# Patient Record
Sex: Male | Born: 1975 | Race: White | Hispanic: No | Marital: Married | Smoking: Current every day smoker
Health system: Southern US, Community
[De-identification: ages and names within clinical notes are randomized; demographics above are authoritative.]

## PROBLEM LIST (undated history)

## (undated) DIAGNOSIS — M1990 Unspecified osteoarthritis, unspecified site: Secondary | ICD-10-CM

## (undated) DIAGNOSIS — F32A Depression, unspecified: Secondary | ICD-10-CM

## (undated) DIAGNOSIS — F329 Major depressive disorder, single episode, unspecified: Secondary | ICD-10-CM

## (undated) DIAGNOSIS — K219 Gastro-esophageal reflux disease without esophagitis: Secondary | ICD-10-CM

## (undated) HISTORY — DX: Unspecified osteoarthritis, unspecified site: M19.90

---

## 2008-08-25 ENCOUNTER — Emergency Department: Payer: Self-pay | Admitting: Emergency Medicine

## 2010-06-23 ENCOUNTER — Emergency Department: Payer: Self-pay | Admitting: Emergency Medicine

## 2012-08-13 ENCOUNTER — Emergency Department: Payer: Self-pay | Admitting: Emergency Medicine

## 2012-08-22 ENCOUNTER — Emergency Department: Payer: Self-pay | Admitting: Emergency Medicine

## 2012-12-10 ENCOUNTER — Ambulatory Visit: Payer: Self-pay | Admitting: Family Medicine

## 2013-02-27 ENCOUNTER — Ambulatory Visit: Payer: Self-pay | Admitting: Family Medicine

## 2013-04-24 ENCOUNTER — Encounter: Payer: Self-pay | Admitting: Family Medicine

## 2013-05-17 ENCOUNTER — Encounter: Payer: Self-pay | Admitting: Family Medicine

## 2013-06-17 ENCOUNTER — Encounter: Payer: Self-pay | Admitting: Family Medicine

## 2013-10-03 ENCOUNTER — Encounter (HOSPITAL_COMMUNITY): Payer: Self-pay | Admitting: Emergency Medicine

## 2013-10-03 ENCOUNTER — Observation Stay (HOSPITAL_COMMUNITY)
Admission: EM | Admit: 2013-10-03 | Discharge: 2013-10-04 | Disposition: A | Discharge status: Home / Self Care | Attending: Internal Medicine | Admitting: Internal Medicine

## 2013-10-03 DIAGNOSIS — R42 Dizziness and giddiness: Secondary | ICD-10-CM | POA: Diagnosis not present

## 2013-10-03 DIAGNOSIS — F329 Major depressive disorder, single episode, unspecified: Secondary | ICD-10-CM | POA: Insufficient documentation

## 2013-10-03 DIAGNOSIS — R079 Chest pain, unspecified: Principal | ICD-10-CM

## 2013-10-03 DIAGNOSIS — F17200 Nicotine dependence, unspecified, uncomplicated: Secondary | ICD-10-CM | POA: Diagnosis not present

## 2013-10-03 DIAGNOSIS — R05 Cough: Secondary | ICD-10-CM | POA: Insufficient documentation

## 2013-10-03 DIAGNOSIS — J3489 Other specified disorders of nose and nasal sinuses: Secondary | ICD-10-CM | POA: Diagnosis not present

## 2013-10-03 DIAGNOSIS — R0602 Shortness of breath: Secondary | ICD-10-CM | POA: Diagnosis not present

## 2013-10-03 DIAGNOSIS — Z79899 Other long term (current) drug therapy: Secondary | ICD-10-CM | POA: Diagnosis not present

## 2013-10-03 DIAGNOSIS — Z720 Tobacco use: Secondary | ICD-10-CM

## 2013-10-03 DIAGNOSIS — F3289 Other specified depressive episodes: Secondary | ICD-10-CM | POA: Diagnosis not present

## 2013-10-03 DIAGNOSIS — R059 Cough, unspecified: Secondary | ICD-10-CM | POA: Insufficient documentation

## 2013-10-03 HISTORY — DX: Major depressive disorder, single episode, unspecified: F32.9

## 2013-10-03 HISTORY — DX: Gastro-esophageal reflux disease without esophagitis: K21.9

## 2013-10-03 HISTORY — DX: Depression, unspecified: F32.A

## 2013-10-03 NOTE — ED Notes (Addendum)
Per EMS, pt reports intermittent CP with SOB associated x 2 weeks. Pt reports that he wakes up fine and it gets worse throughout the day. NAD at this time. VS stable. Pt took 325 of aspirin prior to ems arrival.

## 2013-10-04 ENCOUNTER — Emergency Department (HOSPITAL_COMMUNITY)

## 2013-10-04 ENCOUNTER — Encounter (HOSPITAL_COMMUNITY): Payer: Self-pay | Admitting: General Practice

## 2013-10-04 DIAGNOSIS — R42 Dizziness and giddiness: Secondary | ICD-10-CM

## 2013-10-04 DIAGNOSIS — F17200 Nicotine dependence, unspecified, uncomplicated: Secondary | ICD-10-CM

## 2013-10-04 DIAGNOSIS — R079 Chest pain, unspecified: Secondary | ICD-10-CM

## 2013-10-04 DIAGNOSIS — Z720 Tobacco use: Secondary | ICD-10-CM | POA: Diagnosis present

## 2013-10-04 LAB — BASIC METABOLIC PANEL
ANION GAP: 16 — AB (ref 5–15)
BUN: 12 mg/dL (ref 6–23)
CO2: 22 meq/L (ref 19–32)
CREATININE: 0.97 mg/dL (ref 0.50–1.35)
Calcium: 8.8 mg/dL (ref 8.4–10.5)
Chloride: 105 mEq/L (ref 96–112)
GFR calc non Af Amer: >90 mL/min (ref >90)
Glucose, Bld: 98 mg/dL (ref 70–99)
Potassium: 4.2 mEq/L (ref 3.7–5.3)
Sodium: 143 mEq/L (ref 137–147)

## 2013-10-04 LAB — TROPONIN I
Troponin I: <0.3 ng/mL (ref <0.30)
Troponin I: <0.3 ng/mL (ref <0.30)

## 2013-10-04 LAB — CBC WITH DIFFERENTIAL/PLATELET
BASOS ABS: 0 10*3/uL (ref 0.0–0.1)
Basophils Relative: 1 % (ref 0–1)
EOS ABS: 0.2 10*3/uL (ref 0.0–0.7)
Eosinophils Relative: 3 % (ref 0–5)
HEMATOCRIT: 41.9 % (ref 39.0–52.0)
Hemoglobin: 14.4 g/dL (ref 13.0–17.0)
Lymphocytes Relative: 25 % (ref 12–46)
Lymphs Abs: 2.2 10*3/uL (ref 0.7–4.0)
MCH: 32.6 pg (ref 26.0–34.0)
MCHC: 34.4 g/dL (ref 30.0–36.0)
MCV: 94.8 fL (ref 78.0–100.0)
MONO ABS: 0.8 10*3/uL (ref 0.1–1.0)
Monocytes Relative: 10 % (ref 3–12)
Neutro Abs: 5.3 10*3/uL (ref 1.7–7.7)
Neutrophils Relative %: 61 % (ref 43–77)
Platelets: 204 10*3/uL (ref 150–400)
RBC: 4.42 MIL/uL (ref 4.22–5.81)
RDW: 12.5 % (ref 11.5–15.5)
WBC: 8.5 10*3/uL (ref 4.0–10.5)

## 2013-10-04 LAB — D-DIMER, QUANTITATIVE: D-Dimer, Quant: <0.27 ug/mL-FEU (ref 0.00–0.48)

## 2013-10-04 LAB — MAGNESIUM: Magnesium: 2.1 mg/dL (ref 1.5–2.5)

## 2013-10-04 LAB — I-STAT TROPONIN, ED: Troponin i, poc: 0 ng/mL (ref 0.00–0.08)

## 2013-10-04 MED ORDER — HEPARIN SODIUM (PORCINE) 5000 UNIT/ML IJ SOLN
5000.0000 [IU] | Freq: Three times a day (TID) | INTRAMUSCULAR | Status: DC
  Administered 2013-10-04: 5000 [IU] via SUBCUTANEOUS
  Filled 2013-10-04: qty 1

## 2013-10-04 MED ORDER — ALBUTEROL SULFATE HFA 108 (90 BASE) MCG/ACT IN AERS: 2.0000 | INHALATION_SPRAY | Freq: Four times a day (QID) | RESPIRATORY_TRACT | Status: DC | PRN

## 2013-10-04 MED ORDER — ALBUTEROL SULFATE (2.5 MG/3ML) 0.083% IN NEBU: 2.5000 mg | INHALATION_SOLUTION | Freq: Four times a day (QID) | RESPIRATORY_TRACT | Status: DC | PRN

## 2013-10-04 MED ORDER — CLOMIPHENE CITRATE 50 MG PO TABS: 50.0000 mg | ORAL_TABLET | ORAL | Status: DC

## 2013-10-04 MED ORDER — BUPROPION HCL ER (XL) 150 MG PO TB24
150.0000 mg | ORAL_TABLET | Freq: Every day | ORAL | Status: DC
  Filled 2013-10-04: qty 1

## 2013-10-04 MED ORDER — ACETAMINOPHEN 325 MG PO TABS: 650.0000 mg | ORAL_TABLET | Freq: Four times a day (QID) | ORAL | Status: DC | PRN

## 2013-10-04 MED ORDER — SODIUM CHLORIDE 0.9 % IJ SOLN: 3.0000 mL | Freq: Two times a day (BID) | INTRAMUSCULAR | Status: DC

## 2013-10-04 MED ORDER — NICOTINE 21 MG/24HR TD PT24
21.0000 mg | MEDICATED_PATCH | Freq: Every day | TRANSDERMAL | Status: DC
  Administered 2013-10-04: 21 mg via TRANSDERMAL
  Filled 2013-10-04: qty 1

## 2013-10-04 MED ORDER — MELOXICAM 15 MG PO TABS
15.0000 mg | ORAL_TABLET | Freq: Every day | ORAL | Status: DC
  Filled 2013-10-04: qty 1

## 2013-10-04 MED ORDER — PANTOPRAZOLE SODIUM 40 MG PO TBEC
40.0000 mg | DELAYED_RELEASE_TABLET | Freq: Every day | ORAL | Status: DC
  Administered 2013-10-04: 40 mg via ORAL
  Filled 2013-10-04: qty 1

## 2013-10-04 MED ORDER — CLOMIPHENE CITRATE 50 MG PO TABS
50.0000 mg | ORAL_TABLET | ORAL | Status: DC
  Filled 2013-10-04: qty 1

## 2013-10-04 MED ORDER — TRAMADOL HCL 50 MG PO TABS: 50.0000 mg | ORAL_TABLET | Freq: Four times a day (QID) | ORAL | Status: AC | PRN

## 2013-10-04 NOTE — H&P (Signed)
[facility] History and Physical  KRISTOFOR MICHALOWSKI ZOX:[id number] DOB: 1976-01-03 DOA: 10/03/2013  Referring physician: EDP PCP: Pcp Not In System   Chief Complaint: Chest pain   HPI: Gabriel Hudson is a 38 y.o. male no PMH, presents with CP, SOB, dizziness and near syncope events for past 3 weeks.  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  He has no family history of sudden cardiac death that he knows about.  Review of Systems: Systems reviewed.  As above, otherwise negative  Past Medical History  Diagnosis Date  . Depression    History reviewed. No pertinent past surgical history. Social History:  reports that he has been smoking Cigarettes.  He has been smoking about 0.75 packs per day. He does not have any smokeless tobacco history on file. He reports that he drinks about 25.2 ounces of alcohol per week. He reports that he does not use illicit drugs.  Allergies  Allergen Reactions  . Citrus Itching    All acidic fruits     No family history on file. Negative for sudden cardiac death that he knows about.  Prior to Admission medications   Medication Sig Start Date End Date Taking? Authorizing Provider  acetaminophen (TYLENOL) 325 MG tablet Take 650 mg by mouth every 6 (six) hours as needed for mild pain.   Yes Historical Provider, MD  albuterol (PROVENTIL HFA;VENTOLIN HFA) 108 (90 BASE) MCG/ACT inhaler Inhale 2 puffs into the lungs every 6 (six) hours as needed for wheezing or shortness of breath.   Yes Historical Provider, MD  buPROPion (WELLBUTRIN XL) 150 MG 24 hr tablet Take 150 mg by mouth at bedtime.   Yes Historical Provider, MD  clomiPHENE (CLOMID) 50 MG tablet Take 50 mg by mouth 3 (three) times a week. Monday, Wednesday and Friday   Yes Historical Provider, MD  cyclobenzaprine (FLEXERIL) 10 MG tablet Take 10 mg by mouth at  bedtime.   Yes Historical Provider, MD  meloxicam (MOBIC) 15 MG tablet Take 15 mg by mouth at bedtime.   Yes Historical Provider, MD  omeprazole (PRILOSEC) 20 MG capsule Take 20 mg by mouth at bedtime.   Yes Historical Provider, MD   Physical Exam: Filed Vitals:   10/04/13 0530  BP: 117/66  Pulse: 83  Temp:   Resp: 14    BP 117/66  Pulse 83  Temp(Src) 98 F (36.7 C) (Oral)  Resp 14  SpO2 94%  General Appearance:    Alert, oriented, no distress, appears stated age  Head:    Normocephalic, atraumatic  Eyes:    PERRL, EOMI, sclera non-icteric        Nose:   Nares without drainage or epistaxis. Mucosa, turbinates normal  Throat:   Moist mucous membranes. Oropharynx without erythema or exudate.  Neck:   Supple. No carotid bruits.  No thyromegaly.  No lymphadenopathy.   Back:     No CVA tenderness, no spinal tenderness  Lungs:     Clear to auscultation bilaterally, without wheezes, rhonchi or rales  Chest wall:    No tenderness to palpitation  Heart:    Regular rate and rhythm without murmurs, gallops, rubs  Abdomen:     Soft, non-tender, nondistended, normal bowel sounds, no organomegaly  Genitalia:    deferred  Rectal:    deferred  Extremities:   No clubbing, cyanosis or  edema.  Pulses:   2+ and symmetric all extremities  Skin:   Skin color, texture, turgor normal, no rashes or lesions  Lymph nodes:   Cervical, supraclavicular, and axillary nodes normal  Neurologic:   CNII-XII intact. Normal strength, sensation and reflexes      throughout    Labs on Admission:  Basic Metabolic Panel:  Recent Labs Lab 10/04/13 0023  NA 143  K 4.2  CL 105  CO2 22  GLUCOSE 98  BUN 12  CREATININE 0.97  CALCIUM 8.8  MG 2.1   Liver Function Tests: No results found for this basename: AST, ALT, ALKPHOS, BILITOT, PROT, ALBUMIN,  in the last 168 hours No results found for this basename: LIPASE, AMYLASE,  in the last 168 hours No results found for this basename: AMMONIA,  in the last  168 hours CBC:  Recent Labs Lab 10/04/13 0023  WBC 8.5  NEUTROABS 5.3  HGB 14.4  HCT 41.9  MCV 94.8  PLT 204   Cardiac Enzymes:  Recent Labs Lab 10/04/13 0324  TROPONINI <0.30    BNP (last 3 results) No results found for this basename: PROBNP,  in the last 8760 hours CBG: No results found for this basename: GLUCAP,  in the last 168 hours  Radiological Exams on Admission: Dg Chest 2 View  10/04/2013   CLINICAL DATA:  Shortness of breath for 3 weeks. Cough and chest pain.  EXAM: CHEST  2 VIEW  COMPARISON:  None.  FINDINGS: The lungs are well-aerated and clear. There is no evidence of focal opacification, pleural effusion or pneumothorax.  The heart is normal in size; the mediastinal contour is within normal limits. No acute osseous abnormalities are seen.  IMPRESSION: No acute cardiopulmonary process seen.   Electronically Signed   By: Roanna Raider M.D.   On: 10/04/2013 01:26    EKG: Independently reviewed.  Assessment/Plan Active Problems:   Dizziness   1. Dizziness, near syncope - The patients EKG wave form is suspicious for a type 3 brugada, will admit patient to tele, and see what cards thinks in AM about his EKG also to decide if there would be any use in sodium channel blocker challenge testing in this situation.  Spoke with Cards fellow who didn't think that this represented brugada, but if still unsure he recommended talking to attending in AM.  Code Status: Full Code  Family Communication: No family in room Disposition Plan: Admit to inpatient   Time spent: 70 min  GARDNER, JARED M. [facility] Pager [phone]  If 7AM-7PM, please contact the day team taking care of the patient [email or web address] Password [facility] 10/04/2013, 5:36 AM

## 2013-10-04 NOTE — Progress Notes (Signed)
Utilization Review Completed.Gabriel Hudson T9/18/2015  

## 2013-10-04 NOTE — Progress Notes (Signed)
Pt discharged to home per MD order. Pt received and reviewed all discharge instructions and medication information including follow-up appointments and prescription information. Pt verbalized understanding. Pt alert and oriented at discharge with no complaints of pain. Pt IV and telemetry box removed prior to discharge. Pt ambulated to private vehicle per pt request. Gabriel Hudson C  

## 2013-10-04 NOTE — ED Notes (Signed)
Patient transported to X-ray 

## 2013-10-04 NOTE — Plan of Care (Signed)
Problem: Phase I Progression Outcomes Goal: Hemodynamically stable Outcome: Progressing Patient admitted from [facility] ED for further management of chest pain.  He arrived via stretcher, alert and oriented x4, in no apparent distress.  Placed on continuous tele and oriented to room and floor procedures.  Denies chest pain or shortness of breath at present.  Will continue to monitor patient condition.

## 2013-10-04 NOTE — Progress Notes (Signed)
Patient ID: Gabriel Hudson  male  ZOX:[id number]    DOB: 12-Dec-1975    DOA: 10/03/2013  PCP: Pcp Not In System  Assessment/Plan: Principal Problem:   Chest pain: Risk factors smoking, no other medical history - Troponin x 3 negative, d-dimer negative - EKG showed RSR pattern in V1 and V2 concerning for brugada syndome, cardiology consulted, will await further recommendations - Patient may need loop recorder and inpatient stress test  Active Problems:   Dizziness - Likely due to #1, currently stable, await cardiology recommendation    Nicotine abuse - Placed on nicotine patch  DVT Prophylaxis:  Code Status:  Family Communication: Discussed with patient and family members at the bedside  Disposition:  Consultants: Cardiology Procedures:  None  Antibiotics:  None    Subjective: Patient seen and examined, denies any chest pain at the time of my encounter, no shortness of breath or fevers or chills. However he has been having chest pain episodes with dyspnea, chest pain radiating to the left arm, episodic in the last 2 weeks  Objective: Weight change:  No intake or output data in the 24 hours ending 10/04/13 1210 Blood pressure 128/83, pulse 82, temperature 98.6 F (37 C), temperature source Oral, resp. rate 18, height  (1.727 m), weight 76.476 kg (168 lb 9.6 oz), SpO2 99.00%.  Physical Exam: General: Alert and awake, oriented x3, not in any acute distress. CVS: S1-S2 clear, no murmur rubs or gallops Chest: clear to auscultation bilaterally, no wheezing, rales or rhonchi Abdomen: soft nontender, nondistended, normal bowel sounds  Extremities: no cyanosis, clubbing or edema noted bilaterally Neuro: Cranial nerves II-XII intact, no focal neurological deficits  Lab Results: Basic Metabolic Panel:  Recent Labs Lab 10/04/13 0023  NA 143  K 4.2  CL 105  CO2 22  GLUCOSE 98  BUN 12  CREATININE 0.97  CALCIUM 8.8  MG 2.1   Liver Function Tests: No  results found for this basename: AST, ALT, ALKPHOS, BILITOT, PROT, ALBUMIN,  in the last 168 hours No results found for this basename: LIPASE, AMYLASE,  in the last 168 hours No results found for this basename: AMMONIA,  in the last 168 hours CBC:  Recent Labs Lab 10/04/13 0023  WBC 8.5  NEUTROABS 5.3  HGB 14.4  HCT 41.9  MCV 94.8  PLT 204   Cardiac Enzymes:  Recent Labs Lab 10/04/13 0324 10/04/13 0930  TROPONINI <0.30 <0.30   BNP: No components found with this basename: POCBNP,  CBG: No results found for this basename: GLUCAP,  in the last 168 hours   Micro Results: No results found for this or any previous visit (from the past 240 hour(s)).  Studies/Results: Dg Chest 2 View  10/04/2013   CLINICAL DATA:  Shortness of breath for 3 weeks. Cough and chest pain.  EXAM: CHEST  2 VIEW  COMPARISON:  None.  FINDINGS: The lungs are well-aerated and clear. There is no evidence of focal opacification, pleural effusion or pneumothorax.  The heart is normal in size; the mediastinal contour is within normal limits. No acute osseous abnormalities are seen.  IMPRESSION: No acute cardiopulmonary process seen.   Electronically Signed   By: Roanna Raider M.D.   On: 10/04/2013 01:26    Medications: Scheduled Meds: . buPROPion  150 mg Oral QHS  . clomiPHENE  50 mg Oral Once per day on Mon Wed Fri  . heparin  5,000 Units Subcutaneous 3 times per day  . meloxicam  15 mg Oral  QHS  . nicotine  21 mg Transdermal Daily  . pantoprazole  40 mg Oral Daily  . sodium chloride  3 mL Intravenous Q12H      LOS: 1 day   RAI,RIPUDEEP M.D. [facility] 10/04/2013, 12:10 PM Pager: [phone]  If 7PM-7AM, please contact night-coverage [email or web address] Password [email or web address]  **Disclaimer: This note was dictated with voice recognition software. Similar sounding words can inadvertently be transcribed and this note may contain transcription errors which may not have been corrected upon publication of  note.**

## 2013-10-04 NOTE — Discharge Summary (Signed)
Physician Discharge Summary  Patient ID: Gabriel Hudson MRN: [id number] DOB/AGE: July 23, 1975 38 y.o.  Admit date: 10/03/2013 Discharge date: 10/04/2013  Primary Care Physician:  Pcp Not In System  Discharge Diagnoses:    . Dizziness . Nicotine abuse . Chest pain  Consults:  Cardiology, Dr. Ladona Ridgel   Recommendations for Outpatient Follow-up:  Patient will need to followup with cardiology outpatient for further workup  Allergies:   Allergies  Allergen Reactions  . Citrus Itching    All acidic fruits      Discharge Medications:   Medication List         acetaminophen 325 MG tablet  Commonly known as:  TYLENOL  Take 650 mg by mouth every 6 (six) hours as needed for mild pain.     albuterol 108 (90 BASE) MCG/ACT inhaler  Commonly known as:  PROVENTIL HFA;VENTOLIN HFA  Inhale 2 puffs into the lungs every 6 (six) hours as needed for wheezing or shortness of breath.     buPROPion 150 MG 24 hr tablet  Commonly known as:  WELLBUTRIN XL  Take 150 mg by mouth at bedtime.     CLOMID 50 MG tablet  Generic drug:  clomiPHENE  Take 50 mg by mouth 3 (three) times a week. Monday, Wednesday and Friday     cyclobenzaprine 10 MG tablet  Commonly known as:  FLEXERIL  Take 10 mg by mouth at bedtime.     meloxicam 15 MG tablet  Commonly known as:  MOBIC  Take 15 mg by mouth at bedtime.     omeprazole 20 MG capsule  Commonly known as:  PRILOSEC  Take 20 mg by mouth at bedtime.     traMADol 50 MG tablet  Commonly known as:  ULTRAM  Take 1 tablet (50 mg total) by mouth every 6 (six) hours as needed for moderate pain.         Brief H and P: For complete details please refer to admission H and P, but in brief Gabriel Hudson is a 38 y.o. male no PMH, presents with CP, SOB, dizziness and near syncope events for past 3 weeks. While his risk factors, history, and heart score are unimpressive for CAD, his work up in the ED did have an EKG with incedental finding suspicious for a  type 3 brugada pattern.   Hospital Course:  Chest pain: Risk factors smoking, no other medical history  Troponin x 3 negative, d-dimer negative. EKG showed RSR pattern in V1 and V2 concerning for brugada syndome. Cardiology was consulted and given his low cardiac risk, recommended to rule out ACS. Otherwise no cardiac interventions at this time. Patient was recommended followup outpatient with Dr. Ladona Ridgel outpatient.  Dizziness  - Likely due to #1, currently stable, await cardiology recommendation   Nicotine abuse  - Placed on nicotine patch   Day of Discharge BP 101/59  Pulse 80  Temp(Src) 98.6 F (37 C) (Oral)  Resp 16  Ht  (1.727 m)  Wt 76.476 kg (168 lb 9.6 oz)  BMI 25.64 kg/m2  SpO2 98%  Physical Exam: General: Alert and awake oriented x3 not in any acute distress. HEENT: anicteric sclera, pupils reactive to light and accommodation CVS: S1-S2 clear no murmur rubs or gallops Chest: clear to auscultation bilaterally, no wheezing rales or rhonchi Abdomen: soft nontender, nondistended, normal bowel sounds Extremities: no cyanosis, clubbing or edema noted bilaterally Neuro: Cranial nerves II-XII intact, no focal neurological deficits   The results of significant diagnostics from  this hospitalization (including imaging, microbiology, ancillary and laboratory) are listed below for reference.    LAB RESULTS: Basic Metabolic Panel:  Recent Labs Lab 10/04/13 0023  NA 143  K 4.2  CL 105  CO2 22  GLUCOSE 98  BUN 12  CREATININE 0.97  CALCIUM 8.8  MG 2.1   Liver Function Tests: No results found for this basename: AST, ALT, ALKPHOS, BILITOT, PROT, ALBUMIN,  in the last 168 hours No results found for this basename: LIPASE, AMYLASE,  in the last 168 hours No results found for this basename: AMMONIA,  in the last 168 hours CBC:  Recent Labs Lab 10/04/13 0023  WBC 8.5  NEUTROABS 5.3  HGB 14.4  HCT 41.9  MCV 94.8  PLT 204   Cardiac Enzymes:  Recent  Labs Lab 10/04/13 0324 10/04/13 0930  TROPONINI <0.30 <0.30   BNP: No components found with this basename: POCBNP,  CBG: No results found for this basename: GLUCAP,  in the last 168 hours  Significant Diagnostic Studies:  Dg Chest 2 View  10/04/2013   CLINICAL DATA:  Shortness of breath for 3 weeks. Cough and chest pain.  EXAM: CHEST  2 VIEW  COMPARISON:  None.  FINDINGS: The lungs are well-aerated and clear. There is no evidence of focal opacification, pleural effusion or pneumothorax.  The heart is normal in size; the mediastinal contour is within normal limits. No acute osseous abnormalities are seen.  IMPRESSION: No acute cardiopulmonary process seen.   Electronically Signed   By: Roanna Raider M.D.   On: 10/04/2013 01:26       Disposition and Follow-up:    DISPOSITION: Home  DIET: Heart healthy    DISCHARGE FOLLOW-UP Follow-up Information   Follow up with Lewayne Bunting, MD. Schedule an appointment as soon as possible for a visit in 2 weeks. (office will call you Monday with appointment for hospital follow-up)    Specialty:  Cardiology   Contact information:   [address] [place] [address] [phone]       Time spent on Discharge: 35 minutes  Signed:   RAI,RIPUDEEP M.D. [facility] 10/04/2013, 3:42 PM Pager: [phone]

## 2013-10-04 NOTE — ED Provider Notes (Signed)
CSN: [id number]     Arrival date & time 10/03/13  2344 History   First MD Initiated Contact with Patient 10/03/13 2354     Chief Complaint  Patient presents with  . Chest Pain  . Shortness of Breath     (Consider location/radiation/quality/duration/timing/severity/associated sxs/prior Treatment) HPI Gabriel Hudson is a 38 y.o. male with no significant past medical history coming in with chest pain shortness of breath for the past 3 weeks. Patient states the pain is midsternal and heavy. It radiates to his back at times it feels sharp. He denies emesis or diaphoresis. She states it occurs 2 hours after waking up and last all day. Yesterday he was dizzy with an episode of presyncope. He denies any syncopal episodes. Patient does currently have a cough and is congested. It is productive of clear sputum. Patient has no fevers chills or recent infections. He has no further complaints.  10 Systems reviewed and are negative for acute change except as noted in the HPI.     Past Medical History  Diagnosis Date  . Depression    History reviewed. No pertinent past surgical history. No family history on file. History  Substance Use Topics  . Smoking status: Current Every Day Smoker -- 0.75 packs/day    Types: Cigarettes  . Smokeless tobacco: Not on file  . Alcohol Use: 25.2 oz/week    42 Cans of beer per week    Review of Systems    Allergies  Citrus  Home Medications   Prior to Admission medications   Medication Sig Start Date End Date Taking? Authorizing Provider  acetaminophen (TYLENOL) 325 MG tablet Take 650 mg by mouth every 6 (six) hours as needed for mild pain.   Yes Historical Provider, MD  albuterol (PROVENTIL HFA;VENTOLIN HFA) 108 (90 BASE) MCG/ACT inhaler Inhale 2 puffs into the lungs every 6 (six) hours as needed for wheezing or shortness of breath.   Yes Historical Provider, MD  buPROPion (WELLBUTRIN XL) 150 MG 24 hr tablet Take 150 mg by mouth at bedtime.   Yes  Historical Provider, MD  clomiPHENE (CLOMID) 50 MG tablet Take 50 mg by mouth 3 (three) times a week. Monday, Wednesday and Friday   Yes Historical Provider, MD  cyclobenzaprine (FLEXERIL) 10 MG tablet Take 10 mg by mouth at bedtime.   Yes Historical Provider, MD  meloxicam (MOBIC) 15 MG tablet Take 15 mg by mouth at bedtime.   Yes Historical Provider, MD  omeprazole (PRILOSEC) 20 MG capsule Take 20 mg by mouth at bedtime.   Yes Historical Provider, MD   BP 113/74  Pulse 87  Temp(Src) 97.6 F (36.4 C) (Oral)  Resp 17  SpO2 98% Physical Exam  Nursing note and vitals reviewed. Constitutional: He is oriented to person, place, and time. Vital signs are normal. He appears well-developed and well-nourished.  Non-toxic appearance. He does not appear ill. No distress.  HENT:  Head: Normocephalic and atraumatic.  Nose: Nose normal.  Mouth/Throat: Oropharynx is clear and moist. No oropharyngeal exudate.  Eyes: Conjunctivae and EOM are normal. Pupils are equal, round, and reactive to light. No scleral icterus.  Neck: Normal range of motion. Neck supple. No tracheal deviation, no edema, no erythema and normal range of motion present. No mass and no thyromegaly present.  Cardiovascular: Normal rate, regular rhythm, S1 normal, S2 normal, normal heart sounds, intact distal pulses and normal pulses.  Exam reveals no gallop and no friction rub.   No murmur heard. Pulses:  Radial pulses are 2+ on the right side, and 2+ on the left side.       Dorsalis pedis pulses are 2+ on the right side, and 2+ on the left side.  Pulmonary/Chest: Effort normal and breath sounds normal. No respiratory distress. He has no wheezes. He has no rhonchi. He has no rales.  Abdominal: Soft. Normal appearance and bowel sounds are normal. He exhibits no distension, no ascites and no mass. There is no hepatosplenomegaly. There is no tenderness. There is no rebound, no guarding and no CVA tenderness.  Musculoskeletal: Normal  range of motion. He exhibits no edema and no tenderness.  Lymphadenopathy:    He has no cervical adenopathy.  Neurological: He is alert and oriented to person, place, and time. He has normal strength. No cranial nerve deficit or sensory deficit. GCS eye subscore is 4. GCS verbal subscore is 5. GCS motor subscore is 6.  Skin: Skin is warm, dry and intact. No petechiae and no rash noted. He is not diaphoretic. No erythema. No pallor.  Psychiatric: He has a normal mood and affect. His behavior is normal. Judgment normal.    ED Course  Procedures (including critical care time) Labs Review Labs Reviewed  BASIC METABOLIC PANEL - Abnormal; Notable for the following:    Anion gap 16 (*)    All other components within normal limits  CBC WITH DIFFERENTIAL  MAGNESIUM  TROPONIN I  Rosezena Sensor, ED    Imaging Review Dg Chest 2 View  10/04/2013   CLINICAL DATA:  Shortness of breath for 3 weeks. Cough and chest pain.  EXAM: CHEST  2 VIEW  COMPARISON:  None.  FINDINGS: The lungs are well-aerated and clear. There is no evidence of focal opacification, pleural effusion or pneumothorax.  The heart is normal in size; the mediastinal contour is within normal limits. No acute osseous abnormalities are seen.  IMPRESSION: No acute cardiopulmonary process seen.   Electronically Signed   By: Roanna Raider M.D.   On: 10/04/2013 01:26     EKG Interpretation   Date/Time:  Friday October 04 2013 00:40:08 EDT Ventricular Rate:  84 PR Interval:  133 QRS Duration: 97 QT Interval:  376 QTC Calculation: 444 R Axis:   9 Text Interpretation:  Sinus rhythm RSR' pattern in V1 and V2, concerning  for Brugada Confirmed by Erroll Luna [id number]) on 10/04/2013  5:46:05 AM      MDM   Final diagnoses:  None    Patient presents emergency department a concern for chest pain shortness of breath. Patient also has a history of presyncopal episodes. His EKG reveals an RSR with ST elevation in V2,  concerning for Brugada syndrome. The cardiology fellow was consult  and did not feel that admission was warranted based on the EKG. I do have increasing concern for this syndrome given the presyncope history, thus patient was admitted to medicine for EP evaluation in the morning.    Tomasita Crumble, MD 10/04/13 [phone]

## 2013-10-04 NOTE — Consult Note (Signed)
ELECTROPHYSIOLOGY CONSULT NOTE    Patient ID: Gabriel Hudson MRN: [id number], DOB/AGE: 1975/05/03 38 y.o.  Admit date: 10/03/2013 Date of Consult: 10-04-13  Primary Physician: Pcp Not In System Primary Cardiologist: new to [facility]  Reason for Consultation: chest pain  HPI:  Gabriel Hudson is a 38 y.o. male with a past medical history significant for depression and GERD.  For the last 2 weeks, he has been having chest pain (described as pressure), shortness of breath, and dizziness.  The dizziness has been most recent in onset.  On the day of admission, he had sustained pain and dizziness for almost the whole day and sought attention at [facility] ER.  He describes his shortness of breath as inability to take a deep breath.  He has not had frank syncope.  His pain is not related to exertion.    EKG on arrival to ED was concerning for possible Brugada syndrome.  EP has been asked to evaluate for treatment options.  He denies chest pain prior to 3 weeks ago, orthopnea, PND, frank syncope, recent fevers, chills, nausea, vomiting, diarrhea.  He does state that he has had increased stress recently.  ROS is otherwise negative.   He smokes less than a pack of cigarettes per day, he drinks 4-5 beers per night, denies recreational drug use.  He lives at home with his wife.  They have been unable to have children.   Past Medical History  Diagnosis Date  . Depression   . GERD (gastroesophageal reflux disease)      Surgical History: History reviewed. No pertinent past surgical history.   Prescriptions prior to admission  Medication Sig Dispense Refill  . acetaminophen (TYLENOL) 325 MG tablet Take 650 mg by mouth every 6 (six) hours as needed for mild pain.      Marland Kitchen albuterol (PROVENTIL HFA;VENTOLIN HFA) 108 (90 BASE) MCG/ACT inhaler Inhale 2 puffs into the lungs every 6 (six) hours as needed for wheezing or shortness of breath.      Marland Kitchen buPROPion (WELLBUTRIN XL) 150 MG 24 hr tablet Take 150 mg by  mouth at bedtime.      . clomiPHENE (CLOMID) 50 MG tablet Take 50 mg by mouth 3 (three) times a week. Monday, Wednesday and Friday      . cyclobenzaprine (FLEXERIL) 10 MG tablet Take 10 mg by mouth at bedtime.      . meloxicam (MOBIC) 15 MG tablet Take 15 mg by mouth at bedtime.      Marland Kitchen omeprazole (PRILOSEC) 20 MG capsule Take 20 mg by mouth at bedtime.        Inpatient Medications:  . buPROPion  150 mg Oral QHS  . clomiPHENE  50 mg Oral Once per day on Mon Wed Fri  . heparin  5,000 Units Subcutaneous 3 times per day  . meloxicam  15 mg Oral QHS  . nicotine  21 mg Transdermal Daily  . pantoprazole  40 mg Oral Daily  . sodium chloride  3 mL Intravenous Q12H    Allergies:  Allergies  Allergen Reactions  . Citrus Itching    All acidic fruits     History   Social History  . Marital Status: Married    Spouse Name: N/A    Number of Children: N/A  . Years of Education: N/A   Occupational History  . Not on file.   Social History Main Topics  . Smoking status: Current Every Day Smoker -- 0.75 packs/day for 21 years  Types: Cigarettes  . Smokeless tobacco: Never Used  . Alcohol Use: 25.2 oz/week    42 Cans of beer per week  . Drug Use: No  . Sexual Activity: Not on file   Other Topics Concern  . Not on file   Social History Narrative  . No narrative on file     Family History - No family history of sudden death, crib death, drowning, premature CAD  BP 128/83  Pulse 82  Temp(Src) 98.6 F (37 C) (Oral)  Resp 18  Ht  (1.727 m)  Wt 168 lb 9.6 oz (76.476 kg)  BMI 25.64 kg/m2  SpO2 99%  Physical Exam: Well appearing young man, NAD HEENT: Unremarkable,[place], AT Neck:  No JVD, no thyromegally Back:  No CVA tenderness Lungs:  Clear with no wheezes, rales, or rhonchi HEART:  Regular rate rhythm, no murmurs, no rubs, no clicks Abd:  soft, positive bowel sounds, no organomegally, no rebound, no guarding Ext:  2 plus pulses, no edema, no cyanosis, no  clubbing Skin:  No rashes no nodules Neuro:  CN II through XII intact, motor grossly intact   Labs:   Lab Results  Component Value Date   WBC 8.5 10/04/2013   HGB 14.4 10/04/2013   HCT 41.9 10/04/2013   MCV 94.8 10/04/2013   PLT 204 10/04/2013    Recent Labs Lab 10/04/13 0023  NA 143  K 4.2  CL 105  CO2 22  BUN 12  CREATININE 0.97  CALCIUM 8.8  GLUCOSE 98   Lab Results  Component Value Date   TROPONINI <0.30 10/04/2013    Radiology/Studies: Dg Chest 2 View 10/04/2013   CLINICAL DATA:  Shortness of breath for 3 weeks. Cough and chest pain.  EXAM: CHEST  2 VIEW  COMPARISON:  None.  FINDINGS: The lungs are well-aerated and clear. There is no evidence of focal opacification, pleural effusion or pneumothorax.  The heart is normal in size; the mediastinal contour is within normal limits. No acute osseous abnormalities are seen.  IMPRESSION: No acute cardiopulmonary process seen.   Electronically Signed   By: Roanna Raider M.D.   On: 10/04/2013 01:26  \ ECG ; NSR with IRBBB  A/P 1. Chest pressure, atypical for angina. Consider outpatient exercise treadmill stress test 2. Abnormal ECG - I do not think he has Brugada syndrome but IRBBB. An outpatient 30 day monitor would be appropriate. 3. Disp. - ok for discharge from my perspective.  Leonia Reeves.D.

## 2013-11-13 ENCOUNTER — Encounter: Payer: Self-pay | Admitting: Cardiology

## 2013-11-13 ENCOUNTER — Ambulatory Visit (INDEPENDENT_AMBULATORY_CARE_PROVIDER_SITE_OTHER): Admitting: Cardiology

## 2013-11-13 VITALS — BP 110/84 | HR 86 | Ht 69.0 in | Wt 176.8 lb

## 2013-11-13 DIAGNOSIS — Q248 Other specified congenital malformations of heart: Secondary | ICD-10-CM

## 2013-11-13 DIAGNOSIS — I450 Right fascicular block: Secondary | ICD-10-CM

## 2013-11-13 DIAGNOSIS — I498 Other specified cardiac arrhythmias: Secondary | ICD-10-CM

## 2013-11-13 DIAGNOSIS — I4510 Unspecified right bundle-branch block: Secondary | ICD-10-CM | POA: Insufficient documentation

## 2013-11-13 DIAGNOSIS — R9431 Abnormal electrocardiogram [ECG] [EKG]: Secondary | ICD-10-CM

## 2013-11-13 NOTE — Progress Notes (Signed)
Patient ID: Gabriel Hudson Granlund, male   DOB: 08/30/1975, 38 y.o.   MRN: [id number]     Patient Name: Gabriel Hudson Pallett Date of Encounter: 11/13/2013  Primary Care Provider:  Pcp Not In System Primary Cardiologist:  Lars MassonNELSON, Cambrie Sonnenfeld H   Problem List   Past Medical History  Diagnosis Date  . Depression   . GERD (gastroesophageal reflux disease)   . Arthritis     knees and spine   History reviewed. No pertinent past surgical history.  Allergies  Allergies  Allergen Reactions  . Citrus Itching    All acidic fruits     HPI  Gabriel Hudson Macioce is Hudson 38 y.o. male with Hudson past medical history significant for depression and GERD. For the last 2 weeks, he has been having chest pain (described as pressure), shortness of breath, and dizziness. The dizziness has been most recent in onset. On the day of admission, he had sustained pain and dizziness for almost the whole day and sought attention at [facility]. He describes his shortness of breath as inability to take Hudson deep breath. He has not had frank syncope. His pain is not related to exertion.  EKG on arrival to ED was concerning for possible Brugada syndrome. Dr. 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  The patient states that he has been having frequent palpitations that can last up to hours, they usually associated mild shortness of breath and on one occasion he felt like he is going to pass out but didn't. He denies chest pain prior to 3 weeks ago, orthopnea, PND, frank syncope, recent fevers, chills, nausea, vomiting, diarrhea. He does state that he has had increased stress recently. ROS is otherwise negative.  He smokes less than Hudson pack of cigarettes per day, he drinks 4-5 beers per night, denies recreational drug use. He lives at home with his wife. They have been unable to have children. He denies any history of premature coronary artery disease or sudden cardiac death in  his family.   Home Medications  Prior to Admission medications   Medication Sig Start Date End Date Taking? Authorizing Provider  acetaminophen (TYLENOL) 325 MG tablet Take 650 mg by mouth every 6 (six) hours as needed for mild pain.   Yes Historical Provider, MD  albuterol (PROVENTIL HFA;VENTOLIN HFA) 108 (90 BASE) MCG/ACT inhaler Inhale 2 puffs into the lungs every 6 (six) hours as needed for wheezing or shortness of breath.   Yes Historical Provider, MD  clomiPHENE (CLOMID) 50 MG tablet Take 50 mg by mouth 3 (three) times Hudson week. Monday, Wednesday and Friday   Yes Historical Provider, MD  cyclobenzaprine (FLEXERIL) 10 MG tablet Take 10 mg by mouth at bedtime.   Yes Historical Provider, MD  meloxicam (MOBIC) 15 MG tablet Take 15 mg by mouth at bedtime.   Yes Historical Provider, MD  omeprazole (PRILOSEC) 20 MG capsule Take 20 mg by mouth at bedtime.   Yes Historical Provider, MD  traMADol (ULTRAM) 50 MG tablet Take 1 tablet (50 mg total) by mouth every 6 (six) hours as needed for moderate pain. 10/04/13  Yes Ripudeep Jenna LuoK Rai, MD    Family History  Family History  Problem Relation Age of Onset  . Cancer Mother   . Heart disease Paternal Grandfather     Social History  History   Social History  . Marital Status: Married    Spouse Name: N/Hudson  Number of Children: N/Hudson  . Years of Education: N/Hudson   Occupational History  . Not on file.   Social History Main Topics  . Smoking status: Current Every Day Smoker -- 0.75 packs/day for 21 years    Types: Cigarettes  . Smokeless tobacco: Never Used  . Alcohol Use: 25.2 oz/week    42 Cans of beer per week  . Drug Use: No  . Sexual Activity: Not on file   Other Topics Concern  . Not on file   Social History Narrative  . No narrative on file     Review of Systems, as per HPI, otherwise negative General:  No chills, fever, night sweats or weight changes.  Cardiovascular:  No chest pain, dyspnea on exertion, edema, orthopnea,  palpitations, paroxysmal nocturnal dyspnea. Dermatological: No rash, lesions/masses Respiratory: No cough, dyspnea Urologic: No hematuria, dysuria Abdominal:   No nausea, vomiting, diarrhea, bright red blood per rectum, melena, or hematemesis Neurologic:  No visual changes, wkns, changes in mental status. All other systems reviewed and are otherwise negative except as noted above.  Physical Exam  Blood pressure 110/84, pulse 86, height 5\' 9"  (1.753 m), weight 176 lb 12.8 oz (80.196 kg), SpO2 96.00%.  General: Pleasant, NAD Psych: Normal affect. Neuro: Alert and oriented X 3. Moves all extremities spontaneously. HEENT: Normal  Neck: Supple without bruits or JVD. Lungs:  Resp regular and unlabored, CTA. Heart: RRR no s3, s4, or murmurs. Abdomen: Soft, non-tender, non-distended, BS + x 4.  Extremities: No clubbing, cyanosis or edema. DP/PT/Radials 2+ and equal bilaterally.  Labs:  No results found for this basename: CKTOTAL, CKMB, TROPONINI,  in the last 72 hours Lab Results  Component Value Date   WBC 8.5 10/04/2013   HGB 14.4 10/04/2013   HCT 41.9 10/04/2013   MCV 94.8 10/04/2013   PLT 204 10/04/2013    Lab Results  Component Value Date   DDIMER <0.27 10/04/2013   No components found with this basename: POCBNP,     Component Value Date/Time   NA 143 10/04/2013 0023   K 4.2 10/04/2013 0023   CL 105 10/04/2013 0023   CO2 22 10/04/2013 0023   GLUCOSE 98 10/04/2013 0023   BUN 12 10/04/2013 0023   CREATININE 0.97 10/04/2013 0023   CALCIUM 8.8 10/04/2013 0023   GFRNONAA >90 10/04/2013 0023   GFRAA >90 10/04/2013 0023   No results found for this basename: CHOL, HDL, LDLCALC, TRIG    Accessory Clinical Findings  Echocardiogram - none  ECG - SR, iRBBB    Assessment & Plan  1. Chest pressure, atypical for angina. They'll proceed which exercise tolerance treadmill stress test.   2. Abnormal ECG - per Dr. Ladona Ridgelaylor this is not  Brugada syndrome but IRBBBHowever he recommended 30 day  monitor.   3. Blood pressure - normal  Follow up in 2 months.   Lars MassonNELSON, Jaiyanna Safran H, MD, [facility] 11/13/2013, 3:36 PM

## 2013-11-13 NOTE — Patient Instructions (Addendum)
Your physician recommends that you continue on your current medications as directed. Please refer to the Current Medication list given to you today.   Your physician has requested that you have an exercise tolerance test. For further information please visit [email or web address]. Please also follow instruction sheet, as given.    Your physician has recommended that you wear an event monitor. Event monitors are medical devices that record the heart's electrical activity. Doctors most often us these monitors to diagnose arrhythmias. Arrhythmias are problems with the speed or rhythm of the heartbeat. The monitor is a small, portable device. You can wear one while you do your normal daily activities. This is usually used to diagnose what is causing palpitations/syncope (passing out).    Your physician recommends that you schedule a follow-up appointment in: AFTER YOUR TEST

## 2013-11-18 ENCOUNTER — Encounter (INDEPENDENT_AMBULATORY_CARE_PROVIDER_SITE_OTHER)

## 2013-11-18 ENCOUNTER — Other Ambulatory Visit: Payer: Self-pay | Admitting: *Deleted

## 2013-11-18 ENCOUNTER — Encounter: Payer: Self-pay | Admitting: *Deleted

## 2013-11-18 DIAGNOSIS — R42 Dizziness and giddiness: Secondary | ICD-10-CM

## 2013-11-18 DIAGNOSIS — R002 Palpitations: Secondary | ICD-10-CM

## 2013-11-18 DIAGNOSIS — I498 Other specified cardiac arrhythmias: Secondary | ICD-10-CM

## 2013-11-18 DIAGNOSIS — R9431 Abnormal electrocardiogram [ECG] [EKG]: Secondary | ICD-10-CM

## 2013-11-18 NOTE — Progress Notes (Signed)
Patient ID: Gabriel LazarVictor A Hudson, male   DOB: 02/15/1975, 38 y.o.   MRN: [id number] [employer] 30 day cardiac event monitor applied to patient.

## 2013-11-19 ENCOUNTER — Telehealth: Payer: Self-pay | Admitting: *Deleted

## 2013-11-19 DIAGNOSIS — R42 Dizziness and giddiness: Secondary | ICD-10-CM

## 2013-11-19 DIAGNOSIS — R002 Palpitations: Secondary | ICD-10-CM

## 2013-11-19 NOTE — Telephone Encounter (Signed)
-----   Message from Ernst BowlerShelly A Wells sent at 11/14/2013  6:04 PM EDT ----- Rhett BannisterHi Ivy, I was going over my schedule for Monday 11-18-13.  This patient had a 30 day cardiac event monitor ordered by Dr. Delton SeeNelson. Dx listed was Brugada syndrome.  In office note Dr. Ladona Ridgelaylor said he did not think it was Brugada but recommended a 30 day event monitor.  Patient has also been experiencing dizziness R42.0 and Palpitations R00.2 . Could you please put in a new order with these diagnosis associated.  These would be covered by normal insurance for a 30 day event.

## 2013-11-19 NOTE — Telephone Encounter (Signed)
Order placed under appropriate diagnosis per monitor tech Shelly.

## 2013-12-06 ENCOUNTER — Telehealth (HOSPITAL_COMMUNITY): Payer: Self-pay

## 2013-12-06 NOTE — Telephone Encounter (Signed)
Encounter complete. 

## 2013-12-11 ENCOUNTER — Ambulatory Visit (HOSPITAL_COMMUNITY)
Admission: RE | Admit: 2013-12-11 | Discharge: 2013-12-11 | Disposition: A | Discharge status: Home / Self Care | Source: Ambulatory Visit | Attending: Internal Medicine | Admitting: Internal Medicine

## 2013-12-11 DIAGNOSIS — R0789 Other chest pain: Secondary | ICD-10-CM | POA: Insufficient documentation

## 2013-12-11 DIAGNOSIS — I498 Other specified cardiac arrhythmias: Secondary | ICD-10-CM

## 2013-12-11 DIAGNOSIS — R079 Chest pain, unspecified: Secondary | ICD-10-CM

## 2013-12-11 DIAGNOSIS — R9431 Abnormal electrocardiogram [ECG] [EKG]: Secondary | ICD-10-CM

## 2013-12-11 NOTE — Procedures (Signed)
Exercise Treadmill Test  Pre-Exercise Testing Evaluation Rhythm: normal sinus                  Test  Exercise Tolerance Test Ordering MD: Tobias AlexanderKatarina Nelson, MD    Unique Test No: 1  Treadmill:  1  Indication for ETT: chest pain - rule out ischemia  Contraindication to ETT: No   Stress Modality: exercise - treadmill  Cardiac Imaging Performed: non   Protocol: standard Bruce - maximal  Max BP:  186/92  Max MPHR (bpm):  184 85% MPR (bpm):  156  MPHR obtained (bpm):  151 % MPHR obtained:  82  Reached 85% MPHR (min:sec):  0 Total Exercise Time (min-sec):  11  Workload in METS:  13.4 Borg Scale: 16  Reason ETT Terminated:  Pt. request due to SOB, Fatigue and Leg Discomfort    ST Segment Analysis At Rest: normal ST segments - no evidence of significant ST depression With Exercise: no evidence of significant ST depression  Other Information Arrhythmia:  No Angina during ETT:  absent (0) Quality of ETT:  diagnostic  ETT Interpretation:  normal - no evidence of ischemia by ST analysis  Comments: Nl GXT  Recommendations: Follow up with Dr. Delton SeeNelson

## 2013-12-13 ENCOUNTER — Telehealth: Payer: Self-pay

## 2013-12-13 NOTE — Telephone Encounter (Signed)
Called patient with normal results of stress test per Dr. Delton SeeNelson. Patient verbalized understanding. Patient had no questions.

## 2013-12-13 NOTE — Telephone Encounter (Signed)
-----   Message from Lars MassonKatarina H Nelson, MD sent at 12/12/2013  3:40 PM EST ----- Normal stress test

## 2013-12-31 ENCOUNTER — Ambulatory Visit (INDEPENDENT_AMBULATORY_CARE_PROVIDER_SITE_OTHER): Admitting: Cardiology

## 2013-12-31 ENCOUNTER — Encounter: Payer: Self-pay | Admitting: Cardiology

## 2013-12-31 VITALS — BP 124/64 | HR 91 | Ht 69.0 in | Wt 178.0 lb

## 2013-12-31 DIAGNOSIS — R42 Dizziness and giddiness: Secondary | ICD-10-CM

## 2013-12-31 DIAGNOSIS — R072 Precordial pain: Secondary | ICD-10-CM

## 2013-12-31 DIAGNOSIS — R55 Syncope and collapse: Secondary | ICD-10-CM

## 2013-12-31 NOTE — Progress Notes (Signed)
Patient ID: Gabriel Hudson, male   DOB: 06/28/1975, 38 y.o.   MRN: [id number]     Patient Name: Gabriel LazarVictor A Hudson Date of Encounter: 12/31/2013  Primary Care Provider:  Pcp Not In System Primary Cardiologist:  Lars MassonNELSON, Jaquin Coy H   Problem List   Past Medical History  Diagnosis Date  . Depression   . GERD (gastroesophageal reflux disease)   . Arthritis     knees and spine   No past surgical history on file.  Allergies  Allergies  Allergen Reactions  . Citrus Itching    Strawberries, watermelon, apples = throat itching    HPI  Gabriel Hudson is a 38 y.o. male with a past medical history significant for depression and GERD. For the last 2 weeks, he has been having chest pain (described as pressure), shortness of breath, and dizziness. The dizziness has been most recent in onset. On the day of admission, he had sustained pain and dizziness for almost the whole day and [facility]. He describes his shortness of breath as inability to take a deep breath. He has not had frank syncope. His pain is not related to exertion.  EKG on arrival to ED was concerning for possible Brugada syndrome. Dr. 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  The patient states that he has been having frequent palpitations that can last up to hours, they usually associated mild shortness of breath and on one occasion he felt like he is going to pass out but didn't. He denies chest pain prior to 3 weeks ago, orthopnea, PND, frank syncope, recent fevers, chills, nausea, vomiting, diarrhea. He does state that he has had increased stress recently. ROS is otherwise negative.  He smokes less than a pack of cigarettes per day, he drinks 4-5 beers per night, denies recreational drug use. He lives at home with his wife. They have been unable to have children. He denies any history of premature coronary artery disease or sudden  cardiac death in his family.  12/31/2013 - the patient underwent surgery today Holter monitoring and reports that during that time he had couple episodes of sharp retrosternal chest pain and some episodes of dizziness. No syncope. He underwent also exercise treadmill stress test that showed no sign of ischemia in very good functional capacity were patient achieved 13 METS.   Home Medications  Prior to Admission medications   Medication Sig Start Date End Date Taking? Authorizing Provider  acetaminophen (TYLENOL) 325 MG tablet Take 650 mg by mouth every 6 (six) hours as needed for mild pain.   Yes Historical Provider, MD  albuterol (PROVENTIL HFA;VENTOLIN HFA) 108 (90 BASE) MCG/ACT inhaler Inhale 2 puffs into the lungs every 6 (six) hours as needed for wheezing or shortness of breath.   Yes Historical Provider, MD  clomiPHENE (CLOMID) 50 MG tablet Take 50 mg by mouth 3 (three) times a week. Monday, Wednesday and Friday   Yes Historical Provider, MD  cyclobenzaprine (FLEXERIL) 10 MG tablet Take 10 mg by mouth at bedtime.   Yes Historical Provider, MD  meloxicam (MOBIC) 15 MG tablet Take 15 mg by mouth at bedtime.   Yes Historical Provider, MD  omeprazole (PRILOSEC) 20 MG capsule Take 20 mg by mouth at bedtime.   Yes Historical Provider, MD  traMADol (ULTRAM) 50 MG tablet Take 1 tablet (50 mg total) by mouth every 6 (six) hours as needed for moderate pain.  10/04/13  Yes Ripudeep Jenna LuoK Rai, MD    Family History  Family History  Problem Relation Age of Onset  . Cancer Mother   . Heart disease Paternal Grandfather     Social History  History   Social History  . Marital Status: Married    Spouse Name: N/A    Number of Children: N/A  . Years of Education: N/A   Occupational History  . Not on file.   Social History Main Topics  . Smoking status: Current Every Day Smoker -- 0.75 packs/day for 21 years    Types: Cigarettes  . Smokeless tobacco: Never Used  . Alcohol Use: 25.2 oz/week     42 Cans of beer per week  . Drug Use: No  . Sexual Activity: Not on file   Other Topics Concern  . Not on file   Social History Narrative     Review of Systems, as per HPI, otherwise negative General:  No chills, fever, night sweats or weight changes.  Cardiovascular:  No chest pain, dyspnea on exertion, edema, orthopnea, palpitations, paroxysmal nocturnal dyspnea. Dermatological: No rash, lesions/masses Respiratory: No cough, dyspnea Urologic: No hematuria, dysuria Abdominal:   No nausea, vomiting, diarrhea, bright red blood per rectum, melena, or hematemesis Neurologic:  No visual changes, wkns, changes in mental status. All other systems reviewed and are otherwise negative except as noted above.  Physical Exam  Blood pressure 124/64, pulse 91, height 5\' 9"  (1.753 m), weight 178 lb (80.74 kg), SpO2 99 %.  General: Pleasant, NAD Psych: Normal affect. Neuro: Alert and oriented X 3. Moves all extremities spontaneously. HEENT: Normal  Neck: Supple without bruits or JVD. Lungs:  Resp regular and unlabored, CTA. Heart: RRR no s3, s4, or murmurs. Abdomen: Soft, non-tender, non-distended, BS + x 4.  Extremities: No clubbing, cyanosis or edema. DP/PT/Radials 2+ and equal bilaterally.  Labs:  No results for input(s): CKTOTAL, CKMB, TROPONINI in the last 72 hours. Lab Results  Component Value Date   WBC 8.5 10/04/2013   HGB 14.4 10/04/2013   HCT 41.9 10/04/2013   MCV 94.8 10/04/2013   PLT 204 10/04/2013    Lab Results  Component Value Date   DDIMER <0.27 10/04/2013   Invalid input(s): POCBNP    Component Value Date/Time   NA 143 10/04/2013 0023   K 4.2 10/04/2013 0023   CL 105 10/04/2013 0023   CO2 22 10/04/2013 0023   GLUCOSE 98 10/04/2013 0023   BUN 12 10/04/2013 0023   CREATININE 0.97 10/04/2013 0023   CALCIUM 8.8 10/04/2013 0023   GFRNONAA >90 10/04/2013 0023   GFRAA >90 10/04/2013 0023   No results found for: CHOL  Accessory Clinical  Findings  Echocardiogram - none  ECG - SR, iRBBB  Exercise treadmill stress test 12/11/2013 Workload in METS: 13.4 Borg Scale: 16  Reason ETT Terminated: Pt. request due to SOB, Fatigue and Leg  Discomfort   ST Segment Analysis At Rest: normal ST segments - no evidence of significant ST  depression With Exercise: no evidence of significant ST depression  Other Information Arrhythmia: No Angina during ETT: absent (0) Quality of ETT: diagnostic  ETT Interpretation: normal - no evidence of ischemia by ST  analysis  Comments: Nl GXT    Assessment & Plan  1. Chest pressure, atypical for angina. Exercise tolerance treadmill stress test was normal and showed excellent exercise capacity without any symptoms or ischemic EKG changes. The patient is advised to follow-up with GI specialist for possible esophageal spasm.  2. Abnormal ECG - per Dr. Ladona Ridgel this is not  Brugada syndrome but IRBBB, However he recommended 30 day monitor. 30 day monitor showed normal sinus rhythm to sinus echocardiogram with no pauses and no arrhythmias whatsoever even during the time patient experienced dizziness or chest pain.  3. Blood pressure - normal  Follow up as needed.   Lars Masson, MD, [facility] 12/31/2013, 11:18 AM

## 2013-12-31 NOTE — Patient Instructions (Signed)
Your physician recommends that you schedule a follow-up appointment as needed with Dr. Delton SeeNelson.

## 2014-01-01 ENCOUNTER — Ambulatory Visit: Payer: Self-pay | Admitting: Family Medicine

## 2014-01-01 ENCOUNTER — Ambulatory Visit: Admitting: Cardiology

## 2014-07-08 ENCOUNTER — Other Ambulatory Visit: Payer: Self-pay | Admitting: Family Medicine

## 2014-07-08 NOTE — Telephone Encounter (Signed)
Routing to provider  

## 2014-07-08 NOTE — Telephone Encounter (Signed)
Pt called stated he needs Meloxicam and Cyclobenzaprine refilled. Pharm is [facility] [place]. Thanks.

## 2014-07-09 MED ORDER — CYCLOBENZAPRINE HCL 10 MG PO TABS
10.0000 mg | ORAL_TABLET | Freq: Every evening | ORAL | Status: DC | PRN
Start: 2014-07-09 — End: 2014-10-09

## 2014-07-09 MED ORDER — MELOXICAM 15 MG PO TABS: 15.0000 mg | ORAL_TABLET | Freq: Every day | ORAL | Status: DC | PRN

## 2014-07-25 IMAGING — CR DG KNEE COMPLETE 4+V*L*
1 series · 5 of 5 positions shown · non-contrast
Comparison: 12/10/2012 opposite knee

CLINICAL DATA: Bilateral knee pain

EXAM:
LEFT KNEE - COMPLETE 4+ VIEW

[Series 1: ap · 0.17mm/px · 5 of 5 slices shown]
[im 1/5]
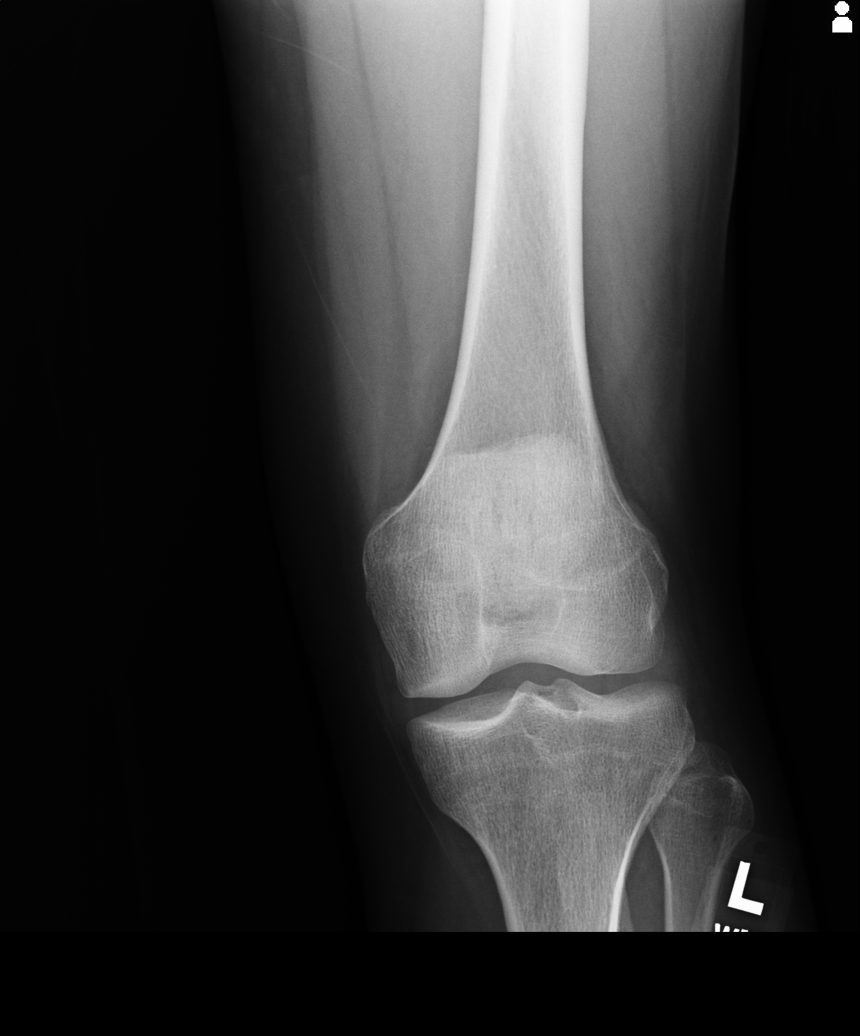
[im 2/5]
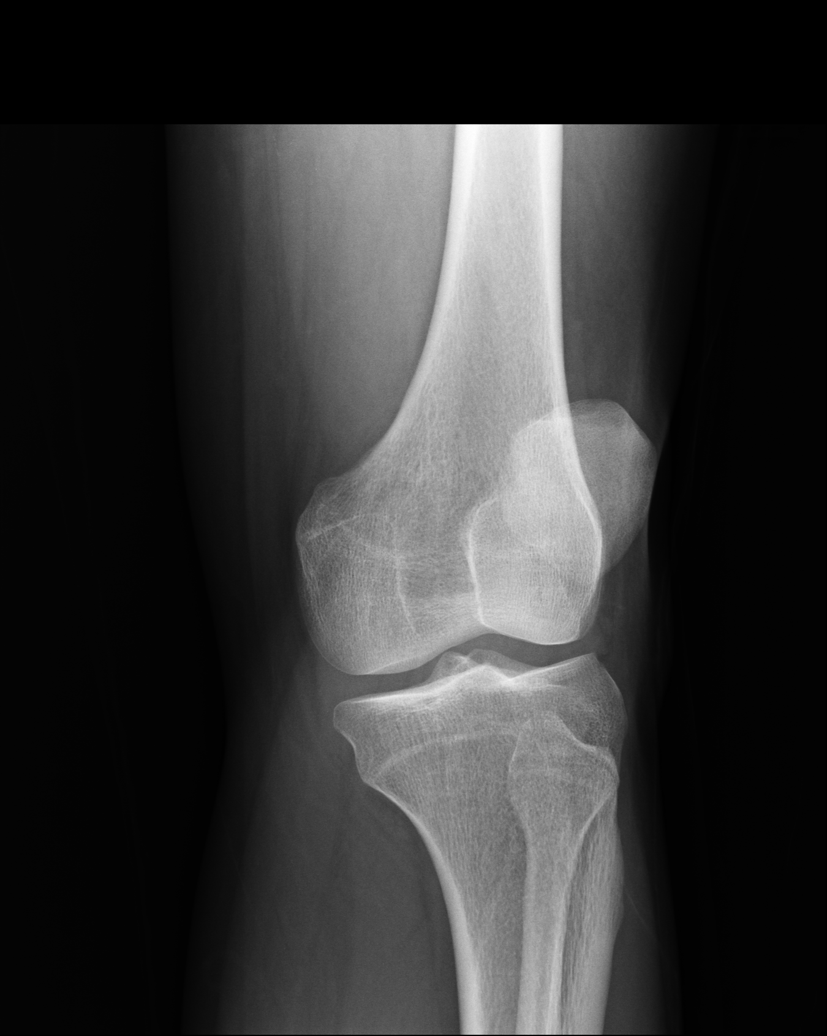
[im 3/5]
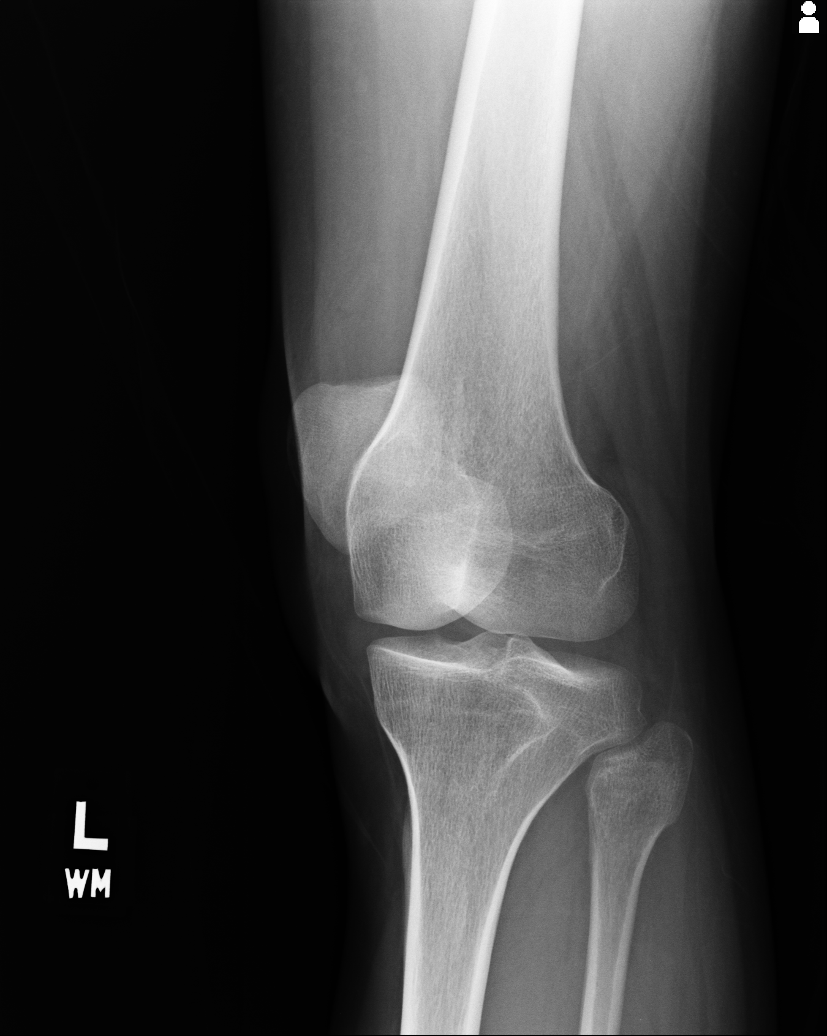
[im 4/5]
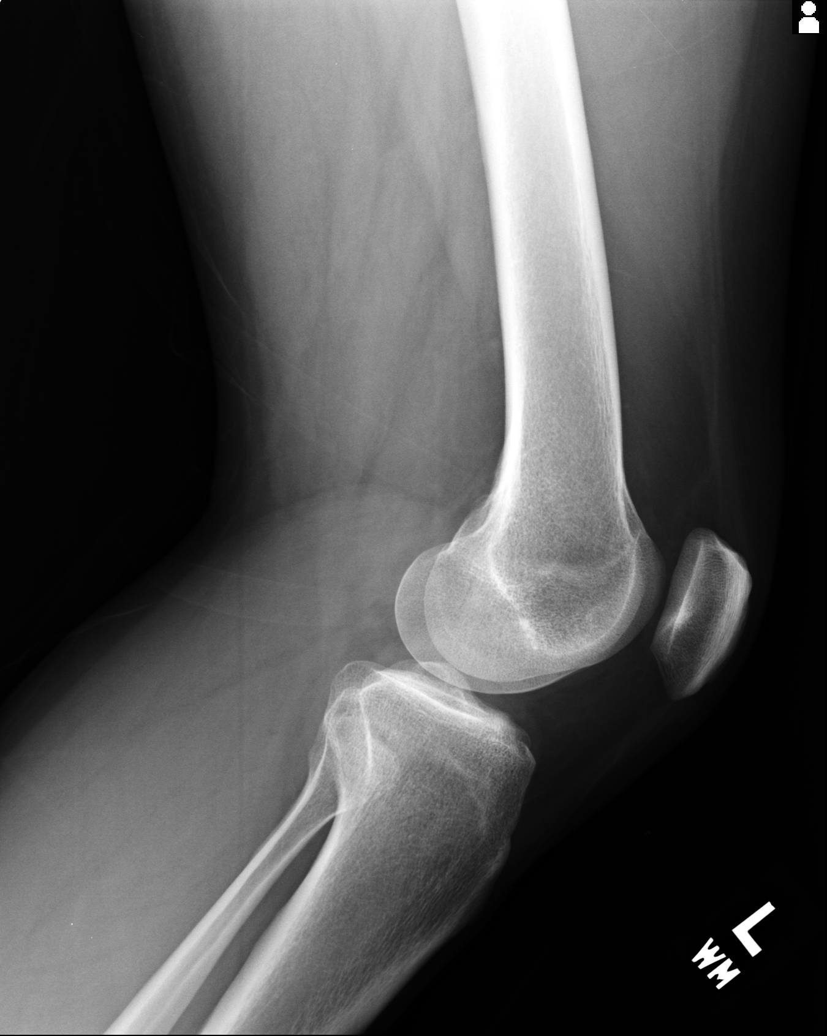
[im 5/5]
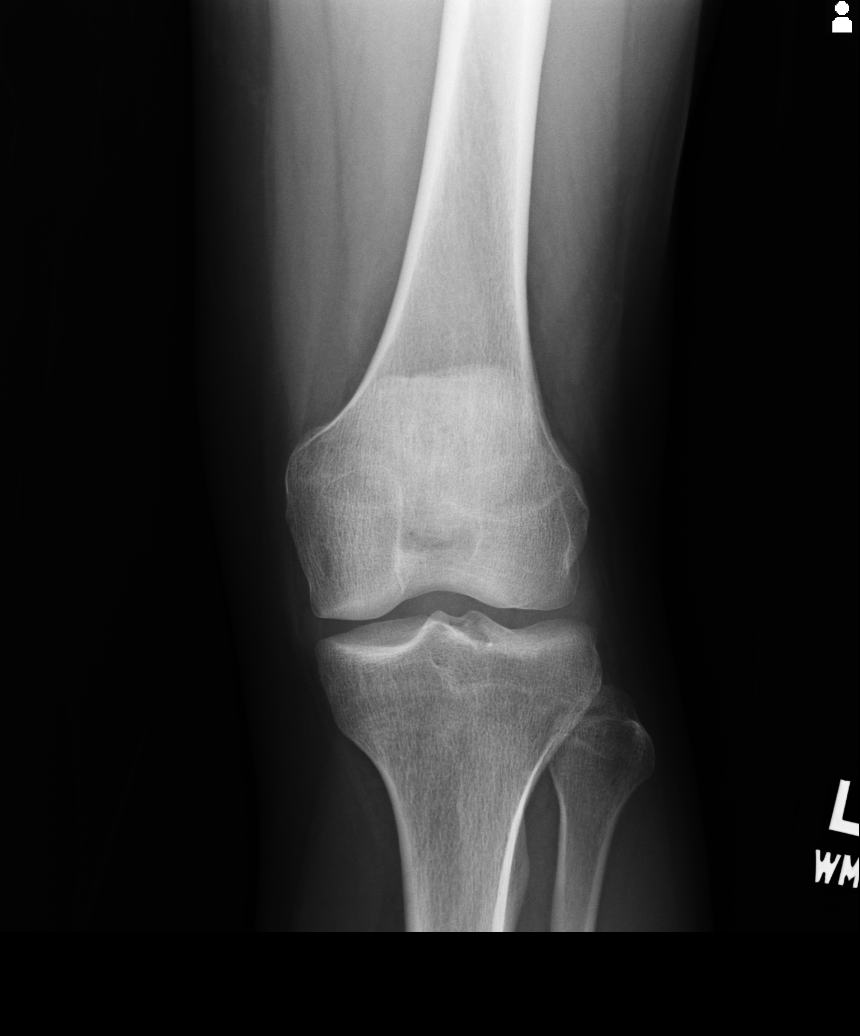

[5 of 5 positions shown; findings below may reference images not displayed]

FINDINGS: There is no evidence of fracture, dislocation, or joint effusion.
There is no evidence of arthropathy or other focal bone abnormality.
Soft tissues are unremarkable.
IMPRESSION: No acute osseous finding

## 2014-10-01 ENCOUNTER — Other Ambulatory Visit: Payer: Self-pay

## 2014-10-01 NOTE — Telephone Encounter (Signed)
[facility] number is [phone] and pharmacy is [facility] [place].

## 2014-10-02 MED ORDER — BUPROPION HCL ER (XL) 150 MG PO TB24: 150.0000 mg | ORAL_TABLET | Freq: Every day | ORAL | Status: AC

## 2014-10-02 NOTE — Telephone Encounter (Signed)
He needs to get this from the [place] please

## 2014-10-02 NOTE — Telephone Encounter (Signed)
I called patient to tell relay message to get Rx from [place], he states he has been trying for three weeks with no success.

## 2014-10-09 ENCOUNTER — Other Ambulatory Visit: Payer: Self-pay

## 2014-10-10 MED ORDER — MELOXICAM 15 MG PO TABS
15.0000 mg | ORAL_TABLET | Freq: Every day | ORAL | Status: DC | PRN
Start: 2014-10-10 — End: 2014-10-23

## 2014-10-10 MED ORDER — CYCLOBENZAPRINE HCL 10 MG PO TABS: 10.0000 mg | ORAL_TABLET | Freq: Every evening | ORAL | Status: DC | PRN

## 2014-10-23 ENCOUNTER — Other Ambulatory Visit: Payer: Self-pay | Admitting: Family Medicine

## 2014-10-23 NOTE — Telephone Encounter (Signed)
Routing to provider. He needs 90 day rx's.

## 2014-10-23 NOTE — Telephone Encounter (Signed)
[facility] call stated they need the following RX'ed for a 90 day supply. Thanks.   Meloxicam  Cyclobenzaprine  Bupropion

## 2014-10-24 MED ORDER — MELOXICAM 15 MG PO TABS: 15.0000 mg | ORAL_TABLET | Freq: Every day | ORAL | Status: AC | PRN

## 2014-10-24 MED ORDER — CYCLOBENZAPRINE HCL 10 MG PO TABS: 10.0000 mg | ORAL_TABLET | Freq: Every evening | ORAL | Status: AC | PRN

## 2014-10-24 NOTE — Telephone Encounter (Signed)
He needs to get the Wellbutrin (bupriopion) from the [place]; this has been an ongoing issue and he has been multiple times

## 2014-10-24 NOTE — Telephone Encounter (Signed)
Patient notified

## 2014-11-03 ENCOUNTER — Other Ambulatory Visit: Payer: Self-pay | Admitting: Family Medicine

## 2014-11-03 NOTE — Telephone Encounter (Signed)
Routing to provider  

## 2014-11-03 NOTE — Telephone Encounter (Signed)
Denied, [place] [facility] [place] with [place]

## 2014-11-03 NOTE — Telephone Encounter (Signed)
Patient has been previously notified.
